# Patient Record
Sex: Male | Born: 1980 | Race: Black or African American | Hispanic: No | Marital: Single | Smoking: Former smoker
Health system: Southern US, Community
[De-identification: ages and names within clinical notes are randomized; demographics above are authoritative.]

---
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
## 2003-03-09 ENCOUNTER — Emergency Department (HOSPITAL_COMMUNITY): Admission: EM | Admit: 2003-03-09 | Discharge: 2003-03-09 | Payer: Self-pay | Admitting: Emergency Medicine

## 2003-03-09 ENCOUNTER — Encounter: Payer: Self-pay | Admitting: Emergency Medicine

## 2003-10-09 ENCOUNTER — Emergency Department (HOSPITAL_COMMUNITY): Admission: EM | Admit: 2003-10-09 | Discharge: 2003-10-09 | Payer: Self-pay | Admitting: Emergency Medicine

## 2004-02-14 ENCOUNTER — Emergency Department (HOSPITAL_COMMUNITY): Admission: EM | Admit: 2004-02-14 | Discharge: 2004-02-14 | Payer: Self-pay | Admitting: Emergency Medicine

## 2004-07-14 ENCOUNTER — Emergency Department (HOSPITAL_COMMUNITY): Admission: EM | Admit: 2004-07-14 | Discharge: 2004-07-14 | Payer: Self-pay | Admitting: Emergency Medicine

## 2005-08-06 ENCOUNTER — Emergency Department (HOSPITAL_COMMUNITY): Admission: EM | Admit: 2005-08-06 | Discharge: 2005-08-06 | Payer: Self-pay | Admitting: Emergency Medicine
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
## 2015-06-01 ENCOUNTER — Encounter (HOSPITAL_COMMUNITY): Payer: Self-pay | Admitting: Emergency Medicine

## 2015-06-01 ENCOUNTER — Emergency Department (HOSPITAL_COMMUNITY)
Admission: EM | Admit: 2015-06-01 | Discharge: 2015-06-01 | Disposition: A | Discharge status: Home / Self Care | Payer: 59 | Attending: Emergency Medicine | Admitting: Emergency Medicine

## 2015-06-01 DIAGNOSIS — F17200 Nicotine dependence, unspecified, uncomplicated: Secondary | ICD-10-CM | POA: Diagnosis not present

## 2015-06-01 DIAGNOSIS — H578 Other specified disorders of eye and adnexa: Secondary | ICD-10-CM | POA: Diagnosis present

## 2015-06-01 DIAGNOSIS — L0201 Cutaneous abscess of face: Secondary | ICD-10-CM

## 2015-06-01 MED ORDER — IBUPROFEN 800 MG PO TABS: 800.0000 mg | ORAL_TABLET | Freq: Three times a day (TID) | ORAL | Status: AC

## 2015-06-01 MED ORDER — SULFAMETHOXAZOLE-TRIMETHOPRIM 800-160 MG PO TABS: 1.0000 | ORAL_TABLET | Freq: Two times a day (BID) | ORAL | Status: AC

## 2015-06-01 MED ORDER — CEPHALEXIN 500 MG PO CAPS: 1000.0000 mg | ORAL_CAPSULE | Freq: Two times a day (BID) | ORAL | Status: DC

## 2015-06-01 NOTE — ED Notes (Signed)
Pt reports noticed a bump along the side of his right eye x 1 week. Pt started messing with the bump on Friday and noticed it getting bigger.

## 2015-06-01 NOTE — ED Notes (Signed)
Declined W/C at D/C and was escorted to lobby by RN. 

## 2015-06-01 NOTE — Discharge Instructions (Signed)
Take antibiotic and apply warm compress to the affected area several times daily.  Return in 48 hrs if no improvement.  Abscess An abscess is an infected area that contains a collection of pus and debris.It can occur in almost any part of the body. An abscess is also known as a furuncle or boil. CAUSES  An abscess occurs when tissue gets infected. This can occur from blockage of oil or sweat glands, infection of hair follicles, or a minor injury to the skin. As the body tries to fight the infection, pus collects in the area and creates pressure under the skin. This pressure causes pain. People with weakened immune systems have difficulty fighting infections and get certain abscesses more often.  SYMPTOMS Usually an abscess develops on the skin and becomes a painful mass that is red, warm, and tender. If the abscess forms under the skin, you may feel a moveable soft area under the skin. Some abscesses break open (rupture) on their own, but most will continue to get worse without care. The infection can spread deeper into the body and eventually into the bloodstream, causing you to feel ill.  DIAGNOSIS  Your caregiver will take your medical history and perform a physical exam. A sample of fluid may also be taken from the abscess to determine what is causing your infection. TREATMENT  Your caregiver may prescribe antibiotic medicines to fight the infection. However, taking antibiotics alone usually does not cure an abscess. Your caregiver may need to make a small cut (incision) in the abscess to drain the pus. In some cases, gauze is packed into the abscess to reduce pain and to continue draining the area. HOME CARE INSTRUCTIONS   Only take over-the-counter or prescription medicines for pain, discomfort, or fever as directed by your caregiver.  If you were prescribed antibiotics, take them as directed. Finish them even if you start to feel better.  If gauze is used, follow your caregiver's directions  for changing the gauze.  To avoid spreading the infection:  Keep your draining abscess covered with a bandage.  Wash your hands well.  Do not share personal care items, towels, or whirlpools with others.  Avoid skin contact with others.  Keep your skin and clothes clean around the abscess.  Keep all follow-up appointments as directed by your caregiver. SEEK MEDICAL CARE IF:   You have increased pain, swelling, redness, fluid drainage, or bleeding.  You have muscle aches, chills, or a general ill feeling.  You have a fever. MAKE SURE YOU:   Understand these instructions.  Will watch your condition.  Will get help right away if you are not doing well or get worse.   This information is not intended to replace advice given to you by your health care provider. Make sure you discuss any questions you have with your health care provider.   Document Released: 03/10/2005 Document Revised: 11/30/2011 Document Reviewed: 08/13/2011 Elsevier Interactive Patient Education [employer]

## 2015-06-01 NOTE — ED Provider Notes (Signed)
CSN: [id number]     Arrival date & time 06/01/15  0845 History  By signing my name below, I, Budd PalmerVanessa Prueter, attest that this documentation has been prepared under the direction and in the presence of Fayrene HelperBowie Savanna Dooley, PA-C. Electronically Signed: Budd PalmerVanessa Prueter, ED Scribe. 06/01/2015. 10:06 AM.      Chief Complaint  Patient presents with  . Eye Problem   The history is provided by the patient. No language interpreter was used.   HPI Comments: Marcus Ramusimothy A Swiech is a 34 y.o. male who presents to the Emergency Department complaining of a growing bump to the side of the eye 2 days ago. He notes it is mildly painful, but does endorse tenderness when it is palpated. He notes he has applied warm compresses without relief. Pt denies any pain and visual disturbances.  Denies any injury.  No fever or chills  History reviewed. No pertinent past medical history. History reviewed. No pertinent past surgical history. No family history on file. Social History  Substance Use Topics  . Smoking status: Current Every Day Smoker  . Smokeless tobacco: None  . Alcohol Use: Yes    Review of Systems  HENT: Positive for facial swelling.   Eyes: Negative for visual disturbance.  Musculoskeletal: Negative for myalgias.    Allergies  Review of patient's allergies indicates no known allergies.  Home Medications   Prior to Admission medications   Medication Sig Start Date End Date Taking? Authorizing Provider  cephALEXin (KEFLEX) 500 MG capsule Take 2 capsules (1,000 mg total) by mouth 2 (two) times daily. 06/01/15   Fayrene HelperBowie Teodor Prater, PA-C  ibuprofen (ADVIL,MOTRIN) 800 MG tablet Take 1 tablet (800 mg total) by mouth 3 (three) times daily. 06/01/15   Fayrene HelperBowie Aeron Donaghey, PA-C  sulfamethoxazole-trimethoprim (BACTRIM DS,SEPTRA DS) 800-160 MG tablet Take 1 tablet by mouth 2 (two) times daily. 06/01/15 06/08/15  Fayrene HelperBowie Dallen Bunte, PA-C   BP 137/92 mmHg  Pulse 95  Temp(Src) 98.4 F (36.9 C) (Oral)  Resp 18  SpO2 100% Physical Exam   Constitutional: He is oriented to person, place, and time. He appears well-developed and well-nourished.  HENT:  Well circumscribed area of induration and fluctuance noted to the R side of the face, lateral to the lateral canthus, mild TTP, edema to upper eyelid  Eyes: Conjunctivae and EOM are normal. Pupils are equal, round, and reactive to light. Right eye exhibits no discharge. Left eye exhibits no discharge.  Pulmonary/Chest: Effort normal. No respiratory distress.  Neurological: He is alert and oriented to person, place, and time. Coordination normal.  Sensation grossly intact  Skin: Skin is warm and dry. No rash noted. He is not diaphoretic. No erythema.  Psychiatric: He has a normal mood and affect.  Nursing note and vitals reviewed.   ED Course  Procedures  DIAGNOSTIC STUDIES: Oxygen Saturation is 100% on RA, normal by my interpretation.    COORDINATION OF CARE: 10:01 AM - Discussed possible cyst vs abscess to R side of face, as well as options to perform I&D or wait 48 hours and continue with warm compresses. Pt does not want I&D at this time and prefers to return in 48 hrs if no improvement with treatment.  Discussed plans to order antibiotics. Advised pt to eat yoghurt with probiotics while on the medication. Pt advised of plan for treatment and pt agrees.   MDM   Final diagnoses:  Cutaneous abscess of face    BP 137/92 mmHg  Pulse 95  Temp(Src) 98.4 F (36.9 C) (Oral)  Resp 18  SpO2 100%   I personally performed the services described in this documentation, which was scribed in my presence. The recorded information has been reviewed and is accurate.     Fayrene Helper, PA-C 06/01/15 1008  Azalia Bilis, MD 06/01/15 1058

## 2015-06-03 ENCOUNTER — Encounter (HOSPITAL_COMMUNITY): Payer: Self-pay | Admitting: Emergency Medicine

## 2015-06-03 ENCOUNTER — Emergency Department (INDEPENDENT_AMBULATORY_CARE_PROVIDER_SITE_OTHER)
Admission: EM | Admit: 2015-06-03 | Discharge: 2015-06-03 | Disposition: A | Discharge status: Home / Self Care | Payer: 59 | Source: Home / Self Care | Attending: Family Medicine | Admitting: Family Medicine

## 2015-06-03 DIAGNOSIS — L0201 Cutaneous abscess of face: Secondary | ICD-10-CM

## 2015-06-03 MED ORDER — LIDOCAINE HCL (PF) 2 % IJ SOLN
INTRAMUSCULAR | Status: AC
  Filled 2015-06-03: qty 2

## 2015-06-03 NOTE — Discharge Instructions (Signed)
Abscess Continue warm compresses, keep area clean, take your antibiotics. An abscess is an infected area that contains a collection of pus and debris.It can occur in almost any part of the body. An abscess is also known as a furuncle or boil. CAUSES  An abscess occurs when tissue gets infected. This can occur from blockage of oil or sweat glands, infection of hair follicles, or a minor injury to the skin. As the body tries to fight the infection, pus collects in the area and creates pressure under the skin. This pressure causes pain. People with weakened immune systems have difficulty fighting infections and get certain abscesses more often.  SYMPTOMS Usually an abscess develops on the skin and becomes a painful mass that is red, warm, and tender. If the abscess forms under the skin, you may feel a moveable soft area under the skin. Some abscesses break open (rupture) on their own, but most will continue to get worse without care. The infection can spread deeper into the body and eventually into the bloodstream, causing you to feel ill.  DIAGNOSIS  Your caregiver will take your medical history and perform a physical exam. A sample of fluid may also be taken from the abscess to determine what is causing your infection. TREATMENT  Your caregiver may prescribe antibiotic medicines to fight the infection. However, taking antibiotics alone usually does not cure an abscess. Your caregiver may need to make a small cut (incision) in the abscess to drain the pus. In some cases, gauze is packed into the abscess to reduce pain and to continue draining the area. HOME CARE INSTRUCTIONS   Only take over-the-counter or prescription medicines for pain, discomfort, or fever as directed by your caregiver.  If you were prescribed antibiotics, take them as directed. Finish them even if you start to feel better.  If gauze is used, follow your caregiver's directions for changing the gauze.  To avoid spreading the  infection:  Keep your draining abscess covered with a bandage.  Wash your hands well.  Do not share personal care items, towels, or whirlpools with others.  Avoid skin contact with others.  Keep your skin and clothes clean around the abscess.  Keep all follow-up appointments as directed by your caregiver. SEEK MEDICAL CARE IF:   You have increased pain, swelling, redness, fluid drainage, or bleeding.  You have muscle aches, chills, or a general ill feeling.  You have a fever. MAKE SURE YOU:   Understand these instructions.  Will watch your condition.  Will get help right away if you are not doing well or get worse.   This information is not intended to replace advice given to you by your health care provider. Make sure you discuss any questions you have with your health care provider.   Document Released: 03/10/2005 Document Revised: 11/30/2011 Document Reviewed: 08/13/2011 Elsevier Interactive Patient Education 2016 Elsevier Inc.  Incision and Drainage Incision and drainage is a procedure in which a sac-like structure (cystic structure) is opened and drained. The area to be drained usually contains material such as pus, fluid, or blood.  LET YOUR CAREGIVER KNOW ABOUT:   Allergies to medicine.  Medicines taken, including vitamins, herbs, eyedrops, over-the-counter medicines, and creams.  Use of steroids (by mouth or creams).  Previous problems with anesthetics or numbing medicines.  History of bleeding problems or blood clots.  Previous surgery.  Other health problems, including diabetes and kidney problems.  Possibility of pregnancy, if this applies. RISKS AND COMPLICATIONS  Pain.  Bleeding.  Scarring.  Infection. BEFORE THE PROCEDURE  You may need to have an ultrasound or other imaging tests to see how large or deep your cystic structure is. Blood tests may also be used to determine if you have an infection or how severe the infection is. You may need  to have a tetanus shot. PROCEDURE  The affected area is cleaned with a cleaning fluid. The cyst area will then be numbed with a medicine (local anesthetic). A small incision will be made in the cystic structure. A syringe or catheter may be used to drain the contents of the cystic structure, or the contents may be squeezed out. The area will then be flushed with a cleansing solution. After cleansing the area, it is often gently packed with a gauze or another wound dressing. Once it is packed, it will be covered with gauze and tape or some other type of wound dressing. AFTER THE PROCEDURE   Often, you will be allowed to go home right after the procedure.  You may be given antibiotic medicine to prevent or heal an infection.  If the area was packed with gauze or some other wound dressing, you will likely need to come back in 1 to 2 days to get it removed.  The area should heal in about 14 days.   This information is not intended to replace advice given to you by your health care provider. Make sure you discuss any questions you have with your health care provider.   Document Released: 11/24/2000 Document Revised: 11/30/2011 Document Reviewed: 07/26/2011 Elsevier Interactive Patient Education [employer]

## 2015-06-03 NOTE — ED Provider Notes (Signed)
CSN: [id number]     Arrival date & time 06/03/15  1724 History   First MD Initiated Contact with Patient 06/03/15 1856     Chief Complaint  Patient presents with  . Abscess   (Consider location/radiation/quality/duration/timing/severity/associated sxs/prior Treatment) HPI Comments: 34 year old male states that he had a tender raised lesion to the right side of the right. It has increased in size over the past few days. He was seen in emergency department 2 days ago and advised that he could treat with warm compresses and antibiotics. He did not get his prescriptions filled. He applied warm compresses once at night. He presents now with increased tenderness and a localized bubble like  fluctuant lesion  just lateral to the right outer canthus. tender and mildly painful. Denies problems with the right eye.    History reviewed. No pertinent past medical history. History reviewed. No pertinent past surgical history. No family history on file. Social History  Substance Use Topics  . Smoking status: Current Every Day Smoker  . Smokeless tobacco: None  . Alcohol Use: Yes    Review of Systems  Constitutional: Negative for fever, chills and activity change.  HENT: Negative.   Respiratory: Negative.   Skin: Positive for wound.       As per history of present illness  Neurological: Negative.   All other systems reviewed and are negative.   Allergies  Review of patient's allergies indicates no known allergies.  Home Medications   Prior to Admission medications   Medication Sig Start Date End Date Taking? Authorizing Provider  cephALEXin (KEFLEX) 500 MG capsule Take 2 capsules (1,000 mg total) by mouth 2 (two) times daily. Patient taking differently: Take 1,000 mg by mouth 2 (two) times daily. DID NOT FILL 06/01/15   Fayrene HelperBowie Tran, PA-C  ibuprofen (ADVIL,MOTRIN) 800 MG tablet Take 1 tablet (800 mg total) by mouth 3 (three) times daily. Patient taking differently: Take 800 mg by mouth 3  (three) times daily. PATIENT DID NOT FILL 06/01/15   Fayrene HelperBowie Tran, PA-C  sulfamethoxazole-trimethoprim (BACTRIM DS,SEPTRA DS) 800-160 MG tablet Take 1 tablet by mouth 2 (two) times daily. Patient taking differently: Take 1 tablet by mouth 2 (two) times daily. PATIENT NOT FILL 06/01/15 06/08/15  Fayrene HelperBowie Tran, PA-C   Meds Ordered and Administered this Visit  Medications - No data to display  BP 132/84 mmHg  Pulse 80  Temp(Src) 98 F (36.7 C) (Oral)  Resp 14  SpO2 100% No data found.   Physical Exam  Constitutional: He is oriented to person, place, and time. He appears well-developed and well-nourished. No distress.  Eyes: Conjunctivae and EOM are normal. Pupils are equal, round, and reactive to light.  Neck: Normal range of motion. Neck supple.  Pulmonary/Chest: Effort normal. No respiratory distress.  Neurological: He is alert and oriented to person, place, and time.  Skin: Skin is warm and dry.  Approximately 2-3 cm right of the right outer canthus there is a raised cystic-like lesion that is erythematous and tender. Well marginated. No surrounding erythema. It does not involve the eye.  Psychiatric: He has a normal mood and affect.  Nursing note and vitals reviewed.   ED Course  .Marland Kitchen.Incision and Drainage Date/Time: 06/03/2015 7:52 PM Performed by: Phineas RealMABE, Maddock Finigan Authorized by: Bradd CanaryKINDL, JAMES D Consent: Verbal consent obtained. Risks and benefits: risks, benefits and alternatives were discussed Consent given by: patient Patient understanding: patient states understanding of the procedure being performed Patient identity confirmed: verbally with patient Type: abscess Body area: head  Location details: face Anesthesia: local infiltration Local anesthetic: lidocaine 1% with epinephrine Anesthetic total: 3 ml Scalpel size: 11 Incision type: single straight Incision depth: dermal Complexity: simple Drainage: purulent Drainage amount: moderate Wound treatment: wound left open Packing  material: none Patient tolerance: Patient tolerated the procedure well with no immediate complications   (including critical care time)  Labs Review Labs Reviewed - No data to display  Imaging Review No results found.   Visual Acuity Review  Right Eye Distance:   Left Eye Distance:   Bilateral Distance:    Right Eye Near:   Left Eye Near:    Bilateral Near:         MDM   1. Cutaneous abscess of face    Likely infected cyst as some of the materials expressed appeared to be cystic wall fragments Continue warm compresses, keep area clean, take your antibiotics.     Hayden Rasmussen, NP 06/03/15 [phone]
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
## 2017-08-17 ENCOUNTER — Emergency Department (HOSPITAL_COMMUNITY)
Admission: EM | Admit: 2017-08-17 | Discharge: 2017-08-17 | Disposition: A | Discharge status: Home / Self Care | Payer: 59 | Attending: Emergency Medicine | Admitting: Emergency Medicine

## 2017-08-17 ENCOUNTER — Encounter (HOSPITAL_COMMUNITY): Payer: Self-pay

## 2017-08-17 ENCOUNTER — Other Ambulatory Visit: Payer: Self-pay

## 2017-08-17 DIAGNOSIS — L0201 Cutaneous abscess of face: Secondary | ICD-10-CM | POA: Insufficient documentation

## 2017-08-17 DIAGNOSIS — F17200 Nicotine dependence, unspecified, uncomplicated: Secondary | ICD-10-CM | POA: Insufficient documentation

## 2017-08-17 DIAGNOSIS — Z79899 Other long term (current) drug therapy: Secondary | ICD-10-CM | POA: Insufficient documentation

## 2017-08-17 MED ORDER — LIDOCAINE-EPINEPHRINE (PF) 2 %-1:200000 IJ SOLN
20.0000 mL | Freq: Once | INTRAMUSCULAR | Status: AC
  Administered 2017-08-17: 20 mL
  Filled 2017-08-17: qty 20

## 2017-08-17 MED ORDER — DOXYCYCLINE HYCLATE 100 MG PO CAPS: 100.0000 mg | ORAL_CAPSULE | Freq: Two times a day (BID) | ORAL | 0 refills | Status: DC

## 2017-08-17 MED ORDER — OXYCODONE-ACETAMINOPHEN 5-325 MG PO TABS
1.0000 | ORAL_TABLET | Freq: Once | ORAL | Status: AC
  Administered 2017-08-17: 1 via ORAL
  Filled 2017-08-17: qty 1

## 2017-08-17 NOTE — Discharge Instructions (Signed)
Warm compresses and light massage to help drain remaining pus. Take antibiotics as prescribed.    For pain you can take 1000 mg tylenol every 6 hours, you can add 600 mg ibuprofen every 6 hours for more pain control.   Symptoms should improve in 48 hours after antibiotics. Return if there is worsening swelling, redness, warmth, swelling to the area or pain with eye movement, fevers

## 2017-08-17 NOTE — ED Triage Notes (Signed)
Pt reports abscess to left outer canthus x 1 week. No eye involvement. No visual disturbances

## 2017-08-17 NOTE — ED Provider Notes (Signed)
[facility] EMERGENCY DEPARTMENT Provider Note   CSN: [id number] Arrival date & time: 08/17/17  1243     History   Chief Complaint Chief Complaint  Patient presents with  . Abscess    HPI Marcus Mcconnell is a 37 y.o. male here for evaluation of boil lateral to left eye x 1 week. Associated with redness, warmth, pain.  Gradually getting worse. Reports history of the same 2 years ago, states it was lanced and symptoms improved. Since it was lanced pt states he has felt a non tender knot to this area however it never grows, he was wondering if it could be a cyst. No fevers, chills, changes to vision, pain with eye movements. No interventions PTA. No h/o immunosuppression, DM.  HPI  History reviewed. No pertinent past medical history.  There are no active problems to display for this patient.   History reviewed. No pertinent surgical history.     Home Medications    Prior to Admission medications   Medication Sig Start Date End Date Taking? Authorizing Provider  cephALEXin (KEFLEX) 500 MG capsule Take 2 capsules (1,000 mg total) by mouth 2 (two) times daily. Patient taking differently: Take 1,000 mg by mouth 2 (two) times daily. DID NOT FILL 06/01/15   Fayrene Helper, PA-C  doxycycline (VIBRAMYCIN) 100 MG capsule Take 1 capsule (100 mg total) by mouth 2 (two) times daily. 08/17/17   Liberty Handy, PA-C  ibuprofen (ADVIL,MOTRIN) 800 MG tablet Take 1 tablet (800 mg total) by mouth 3 (three) times daily. Patient taking differently: Take 800 mg by mouth 3 (three) times daily. PATIENT DID NOT FILL 06/01/15   Fayrene Helper, PA-C    Family History No family history on file.  Social History Social History   Tobacco Use  . Smoking status: Current Every Day Smoker  . Smokeless tobacco: Never Used  Substance Use Topics  . Alcohol use: Yes  . Drug use: No     Allergies   Patient has no known allergies.   Review of Systems Review of Systems  Skin: Positive  for color change.       +boil  All other systems reviewed and are negative.    Physical Exam Updated Vital Signs BP (!) 131/99 (BP Location: Right Arm)   Pulse 94   Temp 98.6 F (37 C) (Oral)   Resp 18   SpO2 100%   Physical Exam  Constitutional: He is oriented to person, place, and time. He appears well-developed and well-nourished. No distress.  NAD.  HENT:  Head: Normocephalic and atraumatic.  Right Ear: External ear normal.  Left Ear: External ear normal.  Nose: Nose normal.  Eyes: Conjunctivae and EOM are normal. No scleral icterus.  PERRL and EOMs intact. No edema, erythema, warmth or tenderness to eyelids.   Neck: Normal range of motion. Neck supple.  Cardiovascular: Normal rate, regular rhythm, normal heart sounds and intact distal pulses.  No murmur heard. Pulmonary/Chest: Effort normal and breath sounds normal. He has no wheezes.  Musculoskeletal: Normal range of motion. He exhibits no deformity.  Neurological: He is alert and oriented to person, place, and time.  Skin: Skin is warm and dry. Capillary refill takes less than 2 seconds.  2 x 2 cm area of fluctuance, erythema, warmth and tenderness to lateral aspect of left eye. See picture. No extension of erythema to periorbital area.   Psychiatric: He has a normal mood and affect. His behavior is normal. Judgment and thought content normal.  Nursing note and vitals reviewed.        ED Treatments / Results  Labs (all labs ordered are listed, but only abnormal results are displayed) Labs Reviewed - No data to display  EKG  EKG Interpretation None       Radiology No results found.  Procedures .Marland Kitchen.Incision and Drainage Date/Time: 08/17/2017 5:07 PM Performed by: Liberty HandyGibbons, Marycarmen Hagey J, PA-C Authorized by: Liberty HandyGibbons, Manmeet Arzola J, PA-C   Consent:    Consent obtained:  Verbal and written   Consent given by:  Patient   Risks discussed:  Bleeding, incomplete drainage, pain, infection and damage to other organs    Alternatives discussed:  Referral Location:    Type:  Abscess   Size:  2x2cm    Location:  Head   Head location:  Face Pre-procedure details:    Procedure prep: alcohol wipe. Anesthesia (see MAR for exact dosages):    Anesthesia method:  Local infiltration   Local anesthetic:  Lidocaine 2% WITH epi Procedure type:    Complexity:  Simple Procedure details:    Needle aspiration: no     Incision types:  Single straight   Scalpel blade:  11   Wound management:  Probed and deloculated   Drainage:  Purulent   Drainage amount:  Moderate   Wound treatment:  Wound left open   Packing materials:  None Post-procedure details:    Patient tolerance of procedure:  Tolerated well, no immediate complications   (including critical care time)  EMERGENCY DEPARTMENT US SOFT TISSUE INTERPRETATION "Study: Limited Soft Tissue Ultrasound"  INDICATIONS: Pain Multiple views of the body part were obtained in real-time with a multi-frequency linear probe  PERFORMED BY: Myself IMAGES ARCHIVED?: Yes SIDE:Left BODY PART:face  INTERPRETATION:  No abcess noted    Medications Ordered in ED Medications  lidocaine-EPINEPHrine (XYLOCAINE W/EPI) 2 %-1:200000 (PF) injection 20 mL (20 mLs Infiltration Given 08/17/17 1635)  oxyCODONE-acetaminophen (PERCOCET/ROXICET) 5-325 MG per tablet 1 tablet (1 tablet Oral Given 08/17/17 1635)     Initial Impression / Assessment and Plan / ED Course  I have reviewed the triage vital signs and the nursing notes.  Pertinent labs & imaging results that were available during my care of the patient were reviewed by me and considered in my medical decision making (see chart for details).    Bedside ultrasound was performed that showed abscess. Incision and drainage was performed with copious amounts of purulent discharge. There was significant improvement in fluctuance and swelling after the procedure. Given location of the abscess near orbital area and lack of PCP will  discharge with antibiotics, warm compresses, high-dose NSAIDs. Discussed return precautions. Patient verbalized understanding and agreeable.  Final Clinical Impressions(s) / ED Diagnoses   Final diagnoses:  Facial abscess    ED Discharge Orders        Ordered    doxycycline (VIBRAMYCIN) 100 MG capsule  2 times daily     08/17/17 1702       Liberty HandyGibbons, Sherley Leser J, [place]PA-C 08/17/17 1710    Loren RacerYelverton, David, MD 08/22/17 [phone]

## 2021-01-01 ENCOUNTER — Other Ambulatory Visit: Payer: Self-pay

## 2021-01-01 ENCOUNTER — Emergency Department (HOSPITAL_BASED_OUTPATIENT_CLINIC_OR_DEPARTMENT_OTHER)
Admission: EM | Admit: 2021-01-01 | Discharge: 2021-01-02 | Disposition: A | Discharge status: Home / Self Care | Payer: Worker's Compensation | Attending: Emergency Medicine | Admitting: Emergency Medicine

## 2021-01-01 ENCOUNTER — Encounter (HOSPITAL_BASED_OUTPATIENT_CLINIC_OR_DEPARTMENT_OTHER): Payer: Self-pay

## 2021-01-01 DIAGNOSIS — S01112A Laceration without foreign body of left eyelid and periocular area, initial encounter: Secondary | ICD-10-CM | POA: Insufficient documentation

## 2021-01-01 DIAGNOSIS — S0592XA Unspecified injury of left eye and orbit, initial encounter: Secondary | ICD-10-CM | POA: Diagnosis present

## 2021-01-01 DIAGNOSIS — S0181XA Laceration without foreign body of other part of head, initial encounter: Secondary | ICD-10-CM

## 2021-01-01 DIAGNOSIS — W228XXA Striking against or struck by other objects, initial encounter: Secondary | ICD-10-CM | POA: Insufficient documentation

## 2021-01-01 DIAGNOSIS — Z87891 Personal history of nicotine dependence: Secondary | ICD-10-CM | POA: Diagnosis not present

## 2021-01-01 NOTE — ED Triage Notes (Signed)
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  Patient has a 3 inch laceration above Left Eye.   Ambulatory, GCS 15. No Neurological Changes. Patient complaining of mild pain to laceration site.

## 2021-01-02 MED ORDER — LIDOCAINE-EPINEPHRINE-TETRACAINE (LET) TOPICAL GEL
3.0000 mL | Freq: Once | TOPICAL | Status: AC
  Administered 2021-01-02: 3 mL via TOPICAL
  Filled 2021-01-02: qty 3

## 2021-01-02 NOTE — ED Provider Notes (Signed)
[facility] EMERGENCY DEPT Provider Note   CSN: [id number] Arrival date & time: 01/01/21  2220     History Chief Complaint  Patient presents with   Laceration    Head    Marcus Mcconnell is a 40 y.o. male.   Laceration Location:  Head/neck Head/neck laceration location:  Head Length:  4 cm Depth:  Cutaneous Quality: straight   Bleeding: controlled   Laceration mechanism:  Blunt object Relieved by:  None tried Worsened by:  Nothing Ineffective treatments:  None tried Tetanus status:  Up to date Associated symptoms: no fever       History reviewed. No pertinent past medical history.  There are no problems to display for this patient.   History reviewed. No pertinent surgical history.     No family history on file.  Social History   Tobacco Use   Smoking status: Former    Types: Cigarettes   Smokeless tobacco: Never  Substance Use Topics   Alcohol use: Yes   Drug use: No    Home Medications Prior to Admission medications   Medication Sig Start Date End Date Taking? Authorizing Provider  cephALEXin (KEFLEX) 500 MG capsule Take 2 capsules (1,000 mg total) by mouth 2 (two) times daily. Patient taking differently: Take 1,000 mg by mouth 2 (two) times daily. DID NOT FILL 06/01/15   Fayrene Helper, PA-C  doxycycline (VIBRAMYCIN) 100 MG capsule Take 1 capsule (100 mg total) by mouth 2 (two) times daily. 08/17/17   Liberty Handy, PA-C  ibuprofen (ADVIL,MOTRIN) 800 MG tablet Take 1 tablet (800 mg total) by mouth 3 (three) times daily. Patient taking differently: Take 800 mg by mouth 3 (three) times daily. PATIENT DID NOT FILL 06/01/15   Fayrene Helper, PA-C    Allergies    Patient has no known allergies.  Review of Systems   Review of Systems  Constitutional:  Negative for fever.  All other systems reviewed and are negative.  Physical Exam Updated Vital Signs BP 140/89 (BP Location: Right Arm)   Pulse 68   Temp 98 F (36.7 C) (Oral)   Resp  19   Ht 6\' 7"  (2.007 m)   Wt 113.4 kg   SpO2 100%   BMI 28.16 kg/m   Physical Exam Vitals and nursing note reviewed.  Constitutional:      Appearance: He is well-developed.  HENT:     Head: Normocephalic.     Comments: 4 cm laceration above left eyebrow, straight, hemostatic    Mouth/Throat:     Mouth: Mucous membranes are moist.     Pharynx: Oropharynx is clear.  Eyes:     Pupils: Pupils are equal, round, and reactive to light.  Cardiovascular:     Rate and Rhythm: Normal rate.  Pulmonary:     Effort: Pulmonary effort is normal. No respiratory distress.  Abdominal:     General: There is no distension.  Musculoskeletal:        General: Normal range of motion.     Cervical back: Normal range of motion.  Skin:    General: Skin is warm and dry.  Neurological:     General: No focal deficit present.     Mental Status: He is alert.    ED Results / Procedures / Treatments   Labs (all labs ordered are listed, but only abnormal results are displayed) Labs Reviewed - No data to display  EKG None  Radiology No results found.  Procedures . Laceration Repair  Date/Time: 01/02/2021 5:56  AM Performed by: Marily Memos, MD Authorized by: Marily Memos, MD   Consent:    Consent obtained:  Verbal   Consent given by:  Patient   Risks discussed:  Infection, need for additional repair, nerve damage, poor wound healing, poor cosmetic result, pain, retained foreign body, tendon damage and vascular damage   Alternatives discussed:  No treatment, delayed treatment and observation Universal protocol:    Procedure explained and questions answered to patient or proxy's satisfaction: yes     Relevant documents present and verified: yes     Test results available: no     Site/side marked: yes     Immediately prior to procedure, a time out was called: yes     Patient identity confirmed:  Verbally with patient Anesthesia:    Anesthesia method:  Topical application   Topical  anesthetic:  LET Laceration details:    Location:  Face   Face location:  Forehead   Length (cm):  4   Depth (mm):  4 Pre-procedure details:    Preparation:  Patient was prepped and draped in usual sterile fashion and imaging obtained to evaluate for foreign bodies Exploration:    Limited defect created (wound extended): no     Imaging outcome: foreign body not noted     Wound exploration: wound explored through full range of motion     Contaminated: no   Treatment:    Area cleansed with:  Saline   Amount of cleaning:  Extensive   Irrigation solution:  Sterile water   Irrigation volume:  100   Irrigation method:  Syringe   Visualized foreign bodies/material removed: no     Debridement:  None Skin repair:    Repair method:  Sutures   Suture size:  5-0   Suture material:  Fast-absorbing gut   Number of sutures:  7 Approximation:    Approximation:  Close Repair type:    Repair type:  Simple Post-procedure details:    Dressing:  Antibiotic ointment   Procedure completion:  Tolerated well, no immediate complications   Medications Ordered in ED Medications  lidocaine-EPINEPHrine-tetracaine (LET) topical gel (3 mLs Topical Given by Other 01/02/21 0236)    ED Course  I have reviewed the triage vital signs and the nursing notes.  Pertinent labs & imaging results that were available during my care of the patient were reviewed by me and considered in my medical decision making (see chart for details).    MDM Rules/Calculators/A&P                         Wound repaired as above. No e/o intracranial injury.   Final Clinical Impression(s) / ED Diagnoses Final diagnoses:  Laceration of forehead, initial encounter    Rx / DC Orders ED Discharge Orders     None        Malon Siddall, Barbara Cower, MD 01/02/21 [phone]

## 2022-07-16 ENCOUNTER — Other Ambulatory Visit (HOSPITAL_BASED_OUTPATIENT_CLINIC_OR_DEPARTMENT_OTHER): Payer: Self-pay

## 2022-07-16 ENCOUNTER — Other Ambulatory Visit: Payer: Self-pay

## 2022-07-16 ENCOUNTER — Encounter (HOSPITAL_BASED_OUTPATIENT_CLINIC_OR_DEPARTMENT_OTHER): Payer: Self-pay

## 2022-07-16 ENCOUNTER — Emergency Department (HOSPITAL_BASED_OUTPATIENT_CLINIC_OR_DEPARTMENT_OTHER)
Admission: EM | Admit: 2022-07-16 | Discharge: 2022-07-16 | Disposition: A | Discharge status: Home / Self Care | Payer: 59 | Attending: Emergency Medicine | Admitting: Emergency Medicine

## 2022-07-16 DIAGNOSIS — R590 Localized enlarged lymph nodes: Secondary | ICD-10-CM | POA: Diagnosis not present

## 2022-07-16 DIAGNOSIS — R030 Elevated blood-pressure reading, without diagnosis of hypertension: Secondary | ICD-10-CM | POA: Insufficient documentation

## 2022-07-16 DIAGNOSIS — R369 Urethral discharge, unspecified: Secondary | ICD-10-CM | POA: Diagnosis not present

## 2022-07-16 DIAGNOSIS — N342 Other urethritis: Secondary | ICD-10-CM | POA: Diagnosis not present

## 2022-07-16 LAB — RPR: RPR Ser Ql: NONREACTIVE

## 2022-07-16 LAB — HIV ANTIBODY (ROUTINE TESTING W REFLEX): HIV Screen 4th Generation wRfx: NONREACTIVE

## 2022-07-16 MED ORDER — DOXYCYCLINE HYCLATE 100 MG PO CAPS: 100.0000 mg | ORAL_CAPSULE | Freq: Two times a day (BID) | ORAL | 0 refills | Status: DC

## 2022-07-16 MED ORDER — DOXYCYCLINE HYCLATE 100 MG PO TABS
100.0000 mg | ORAL_TABLET | Freq: Once | ORAL | Status: AC
  Administered 2022-07-16: 100 mg via ORAL
  Filled 2022-07-16: qty 1

## 2022-07-16 MED ORDER — CEFTRIAXONE SODIUM 500 MG IJ SOLR
500.0000 mg | Freq: Once | INTRAMUSCULAR | Status: AC
  Administered 2022-07-16: 500 mg via INTRAMUSCULAR
  Filled 2022-07-16: qty 500

## 2022-07-16 NOTE — ED Triage Notes (Signed)
Pt reports a white discharge coming from penis tonight.  Denies any pain

## 2022-07-16 NOTE — Discharge Instructions (Addendum)
Do not have sexual relations until you have completed the course of antibiotics.  Do not have sexual relations with your recent partner(s) until both of you have completed a course of antibiotics.  Make sure all sexual partners are aware of your diagnosis and get appropriately diagnosed and treated.

## 2022-07-16 NOTE — ED Provider Notes (Signed)
[place] Provider Note   CSN: [id number] Arrival date & time: 07/16/22  0005     History  Chief Complaint  Patient presents with   Exposure to STD    Marcus Mcconnell is a 42 y.o. male.  The history is provided by the patient.  Exposure to STD  He noted a urethral discharge tonight.  He denies dysuria.  He does admit to recent unprotected sex.   Home Medications Prior to Admission medications   Medication Sig Start Date End Date Taking? Authorizing Provider  doxycycline (VIBRAMYCIN) 100 MG capsule Take 1 capsule (100 mg total) by mouth 2 (two) times daily. 6/0/63   Delora Fuel, MD  ibuprofen (ADVIL,MOTRIN) 800 MG tablet Take 1 tablet (800 mg total) by mouth 3 (three) times daily. Patient taking differently: Take 800 mg by mouth 3 (three) times daily. PATIENT DID NOT FILL 06/01/15   Domenic Moras, PA-C      Allergies    Patient has no known allergies.    Review of Systems   Review of Systems  All other systems reviewed and are negative.   Physical Exam Updated Vital Signs BP (!) 139/92 (BP Location: Right Arm)   Pulse (!) 116   Temp 98.6 F (37 C) (Oral)   Resp 19   Ht 7' (2.134 m)   Wt 127 kg   SpO2 99%   BMI 27.90 kg/m  Physical Exam Vitals and nursing note reviewed.   42 year old male, resting comfortably and in no acute distress. Vital signs are significant for borderline elevated blood pressure and mildly elevated heart rate. Oxygen saturation is 99%, which is normal. Head is normocephalic and atraumatic. PERRLA, EOMI.  Lungs are clear without rales, wheezes, or rhonchi. Chest is nontender. Heart has regular rate and rhythm without murmur. Abdomen is soft, flat, nontender. Genitalia: Circumcised penis, no urethral discharge seen.  Testes descended without masses.  Shotty inguinal adenopathy present bilaterally. Extremities have no cyanosis or edema, full range of motion is present. Skin is warm and dry  without rash. Neurologic: Mental status is normal, cranial nerves are intact, moves all extremities equally.  ED Results / Procedures / Treatments   Labs (all labs ordered are listed, but only abnormal results are displayed) Labs Reviewed  RPR  HIV ANTIBODY (ROUTINE TESTING W REFLEX)  GC/CHLAMYDIA PROBE AMP ([place]) NOT AT [facility]   Procedures Procedures    Medications Ordered in ED Medications  cefTRIAXone (ROCEPHIN) injection 500 mg (has no administration in time range)  doxycycline (VIBRA-TABS) tablet 100 mg (has no administration in time range)    ED Course/ Medical Decision Making/ A&P                             Medical Decision Making Amount and/or Complexity of Data Reviewed Labs: ordered.  Risk Prescription drug management.   Urethritis-likely gonorrhea or chlamydia.  I have ordered intramuscular ceftriaxone and a dose of oral doxycycline and I am giving him a description for a 1 week course of doxycycline.  I have ordered sexually-transmitted infection panel including GC/chlamydia, RPR, HIV testing.  I have encouraged him to use safe sex practices.  Final Clinical Impression(s) / ED Diagnoses Final diagnoses:  Urethritis  Elevated blood pressure reading without diagnosis of hypertension    Rx / DC Orders ED Discharge Orders          Ordered    doxycycline (VIBRAMYCIN)  100 MG capsule  2 times daily        07/16/22 5643              Delora Fuel, MD 32/95/18 0130

## 2022-07-17 ENCOUNTER — Telehealth (HOSPITAL_BASED_OUTPATIENT_CLINIC_OR_DEPARTMENT_OTHER): Payer: Self-pay | Admitting: Emergency Medicine

## 2022-07-17 ENCOUNTER — Other Ambulatory Visit (HOSPITAL_BASED_OUTPATIENT_CLINIC_OR_DEPARTMENT_OTHER): Payer: Self-pay

## 2022-07-17 MED ORDER — DOXYCYCLINE HYCLATE 100 MG PO CAPS: 100.0000 mg | ORAL_CAPSULE | Freq: Two times a day (BID) | ORAL | 0 refills | Status: DC

## 2022-07-17 NOTE — Telephone Encounter (Signed)
Pt called ed reporting he lost the paper Rx he was given last night. Sent rx electronically to preferred pharmacy.

## 2022-07-19 LAB — GC/CHLAMYDIA PROBE AMP (~~LOC~~) NOT AT ARMC
Chlamydia: NEGATIVE
Comment: NEGATIVE
Comment: NORMAL
Neisseria Gonorrhea: NEGATIVE

## 2023-03-08 ENCOUNTER — Encounter (HOSPITAL_COMMUNITY): Payer: Self-pay | Admitting: Emergency Medicine

## 2023-03-08 ENCOUNTER — Emergency Department (HOSPITAL_COMMUNITY)
Admission: EM | Admit: 2023-03-08 | Discharge: 2023-03-08 | Disposition: A | Discharge status: Home / Self Care | Payer: 59 | Attending: Emergency Medicine | Admitting: Emergency Medicine

## 2023-03-08 ENCOUNTER — Other Ambulatory Visit: Payer: Self-pay

## 2023-03-08 DIAGNOSIS — L03211 Cellulitis of face: Secondary | ICD-10-CM | POA: Diagnosis not present

## 2023-03-08 DIAGNOSIS — R220 Localized swelling, mass and lump, head: Secondary | ICD-10-CM | POA: Diagnosis not present

## 2023-03-08 MED ORDER — DOXYCYCLINE HYCLATE 100 MG PO CAPS: 100.0000 mg | ORAL_CAPSULE | Freq: Two times a day (BID) | ORAL | 0 refills | Status: AC

## 2023-03-08 MED ORDER — DOXYCYCLINE HYCLATE 100 MG PO TABS
100.0000 mg | ORAL_TABLET | Freq: Once | ORAL | Status: AC
  Administered 2023-03-08: 100 mg via ORAL
  Filled 2023-03-08: qty 1

## 2023-03-08 NOTE — ED Provider Notes (Signed)
[place] EMERGENCY DEPARTMENT AT [facility] Provider Note   CSN: [id number] Arrival date & time: 03/08/23  0013     History Chief Complaint  Patient presents with   Insect Bite    Marcus Mcconnell is a 42 y.o. male reportedly otherwise healthy presents to the ER for evaluation of left sided facial swelling. The patient reports that he woke up yesterday morning with swelling to the left side of his face near his eye. He thinks it may have been a bug that bit him, however he did not see an insect or remember anything stinging or biting him. He denies any vision changes, fever, headaches, or pain with movement of his eye. He denies any discharge from the area. He reports there is only pain with palpation to the area. NKDA.   HPI     Home Medications Prior to Admission medications   Medication Sig Start Date End Date Taking? Authorizing Provider  doxycycline (VIBRAMYCIN) 100 MG capsule Take 1 capsule (100 mg total) by mouth 2 (two) times daily. 03/08/23   Achille Rich, PA-C  ibuprofen (ADVIL,MOTRIN) 800 MG tablet Take 1 tablet (800 mg total) by mouth 3 (three) times daily. Patient taking differently: Take 800 mg by mouth 3 (three) times daily. PATIENT DID NOT FILL 06/01/15   Fayrene Helper, PA-C      Allergies    Patient has no known allergies.    Review of Systems   Review of Systems  Constitutional:  Negative for chills and fever.  HENT:  Negative for congestion and rhinorrhea.   Eyes:  Negative for photophobia, pain, redness and visual disturbance.  Musculoskeletal:  Negative for neck pain and neck stiffness.  Skin:        Swelling   Neurological:  Negative for headaches.    Physical Exam Updated Vital Signs BP (!) 152/115   Pulse 73   Temp 98.2 F (36.8 C) (Oral)   Resp 18   SpO2 92%  Physical Exam Vitals and nursing note reviewed.  Constitutional:      General: He is not in acute distress.    Appearance: Normal appearance. He is not ill-appearing or  toxic-appearing.  Eyes:     General: No scleral icterus.    Extraocular Movements: Extraocular movements intact.     Pupils: Pupils are equal, round, and reactive to light.     Comments: Area of induration palpation to the lateral aspect of the left sided of the face. It is indurated and mobile. Some overlying erythema with mild warmth. No purulent discharge noted. No fluctuance noted. He does not endorse pain with extraocular movements. PERRLA. Some mild swelling and erythema noted to the lower lid, but not tender to palpation. Soft. No swelling noted to the upper lid. Non tender, soft.   Pulmonary:     Effort: Pulmonary effort is normal. No respiratory distress.  Skin:    General: Skin is dry.     Findings: No rash.  Neurological:     General: No focal deficit present.     Mental Status: He is alert. Mental status is at baseline.  Psychiatric:        Mood and Affect: Mood normal.        ED Results / Procedures / Treatments   Labs (all labs ordered are listed, but only abnormal results are displayed) Labs Reviewed - No data to display  EKG None  Radiology No results found.  Procedures Procedures   Medications Ordered in ED Medications  doxycycline (VIBRA-TABS) tablet 100 mg (has no administration in time range)    ED Course/ Medical Decision Making/ A&P Medical Decision Making Risk Prescription drug management.   42 y.o. male presents to the ER for evaluation of eye swelling. Differential diagnosis includes but is not limited to cellulitis, abscess, periorbital cellulitis, orbital cellutlitis. Vital signs mildly elevated BP, otherwise unremarkable. Physical exam as noted above.   The patient does not appear to have any ocular involvement. He does not have any pain with movement or the eye or have any visual changes. This is likely cellulitis or forming abscess. My attending assessed at bedside and does not recommend CT imaging at this time given the induration  without fluctuance as well as the location. Will prescribe him doxycycline with first dose given in the ER. Discussed the use of warm compresses as well. I educated the patient and family at bedside that this may go away with antibiotics and warm compresses, however it may develop into an abscess that needs draining.   We discussed the results of the labs/imaging. The plan is take antibiotic as prescribed, warm compresses, return if new or worsening symptoms. We discussed strict return precautions and red flag symptoms. The patient verbalized their understanding and agrees to the plan. The patient is stable and being discharged home in good condition.  Portions of this report may have been transcribed using voice recognition software. Every effort was made to ensure accuracy; however, inadvertent computerized transcription errors may be present.   I discussed this case with my attending physician who cosigned this note including patient's presenting symptoms, physical exam, and planned diagnostics and interventions. Attending physician stated agreement with plan or made changes to plan which were implemented.   Attending physician assessed patient at bedside.  Final Clinical Impression(s) / ED Diagnoses Final diagnoses:  Facial cellulitis    Rx / DC Orders ED Discharge Orders          Ordered    doxycycline (VIBRAMYCIN) 100 MG capsule  2 times daily        03/08/23 0421              Achille Rich, PA-C 03/08/23 2725    Dione Booze, MD 03/08/23 [phone]

## 2023-03-08 NOTE — ED Triage Notes (Signed)
Pt c/o insect bite to left side of face close to left eye since Sunday. Pt has red bump and some swelling to area.

## 2023-03-08 NOTE — Discharge Instructions (Addendum)
You were seen in the ER today for evaluation of your facial swelling. I likely think this is some cellulitis, or inflammation of the skin. For this, I recommend warm compresses frequently throughout the day. Make sure it is not too warm as to avoid burning yourself. Please make sure you are cleaning the area daily with Dial soap and water. You can take Tylenol or ibuprofen as needed for pain. Additionally, I am going to prescribe you an antibiotic to take twice daily for the next week. Your first dose was given while you were here. If you have any pain with moving your eye, vision changes, headache, fever, worsening pain/swelling, please return to the nearest ER for re-evaluation. If you have any concerns, new or worsening symptoms, please return to the nearest ER for re-evaluation.   Get help right away if: You have new symptoms. Your symptoms get worse or do not get better with treatment. You have a fever. Your vision becomes blurry or gets worse in any way. Your eye looks like it is sticking out or bulging out (proptosis). You develop double vision. You have trouble moving your eyes or pain when moving your eyes You have a severe headache. You have neck stiffness or severe neck pain. These symptoms may represent a serious problem that is an emergency. Do not wait to see if the symptoms will go away. Get medical help right away. Call your local emergency services (911 in the [place] Do not drive yourself to the hospital.
